# Patient Record
Sex: Male | Born: 1990 | Race: White | Hispanic: No | Marital: Single | Smoking: Never smoker
Health system: Southern US, Community
[De-identification: ages and names within clinical notes are randomized; demographics above are authoritative.]

## PROBLEM LIST (undated history)

## (undated) DIAGNOSIS — J45909 Unspecified asthma, uncomplicated: Secondary | ICD-10-CM

## (undated) HISTORY — PX: TYMPANOSTOMY TUBE PLACEMENT: SHX32

## (undated) HISTORY — PX: KNEE SURGERY: SHX244

---

## 2012-01-12 ENCOUNTER — Encounter (HOSPITAL_COMMUNITY): Payer: Self-pay

## 2012-01-12 ENCOUNTER — Emergency Department (HOSPITAL_COMMUNITY)
Admission: EM | Admit: 2012-01-12 | Discharge: 2012-01-12 | Disposition: A | Discharge status: Home / Self Care | Payer: TRICARE For Life (TFL) | Attending: Emergency Medicine | Admitting: Emergency Medicine

## 2012-01-12 DIAGNOSIS — E860 Dehydration: Secondary | ICD-10-CM | POA: Insufficient documentation

## 2012-01-12 DIAGNOSIS — R112 Nausea with vomiting, unspecified: Secondary | ICD-10-CM | POA: Insufficient documentation

## 2012-01-12 DIAGNOSIS — R000 Tachycardia, unspecified: Secondary | ICD-10-CM | POA: Insufficient documentation

## 2012-01-12 LAB — COMPREHENSIVE METABOLIC PANEL
BUN: 17 mg/dL (ref 6–23)
CO2: 27 mEq/L (ref 19–32)
Calcium: 9.3 mg/dL (ref 8.4–10.5)
Chloride: 98 mEq/L (ref 96–112)
Creatinine, Ser: 0.98 mg/dL (ref 0.50–1.35)
GFR calc Af Amer: >90 mL/min (ref >90)
GFR calc non Af Amer: >90 mL/min (ref >90)
Glucose, Bld: 120 mg/dL — ABNORMAL HIGH (ref 70–99)
Total Bilirubin: 1.8 mg/dL — ABNORMAL HIGH (ref 0.3–1.2)

## 2012-01-12 LAB — CBC WITH DIFFERENTIAL/PLATELET
Eosinophils Relative: 0 % (ref 0–5)
HCT: 45.3 % (ref 39.0–52.0)
Hemoglobin: 15.5 g/dL (ref 13.0–17.0)
Lymphocytes Relative: 11 % — ABNORMAL LOW (ref 12–46)
MCV: 89.3 fL (ref 78.0–100.0)
Monocytes Absolute: 0.6 10*3/uL (ref 0.1–1.0)
Monocytes Relative: 7 % (ref 3–12)
Neutro Abs: 6.7 10*3/uL (ref 1.7–7.7)
RDW: 12.8 % (ref 11.5–15.5)
WBC: 8.1 10*3/uL (ref 4.0–10.5)

## 2012-01-12 LAB — URINALYSIS, ROUTINE W REFLEX MICROSCOPIC
Glucose, UA: NEGATIVE mg/dL
Ketones, ur: NEGATIVE mg/dL
Leukocytes, UA: NEGATIVE
Nitrite: NEGATIVE
Protein, ur: NEGATIVE mg/dL
Urobilinogen, UA: 1 mg/dL (ref 0.0–1.0)

## 2012-01-12 LAB — LIPASE, BLOOD: Lipase: 21 U/L (ref 11–59)

## 2012-01-12 MED ORDER — SODIUM CHLORIDE 0.9 % IV BOLUS (SEPSIS)
1000.0000 mL | Freq: Once | INTRAVENOUS | Status: AC
  Administered 2012-01-12: 1000 mL via INTRAVENOUS

## 2012-01-12 MED ORDER — ONDANSETRON 4 MG PO TBDP
ORAL_TABLET | ORAL | Status: AC
  Administered 2012-01-12: 4 mg via ORAL
  Filled 2012-01-12: qty 1

## 2012-01-12 MED ORDER — ONDANSETRON HCL 4 MG/2ML IJ SOLN
4.0000 mg | Freq: Once | INTRAMUSCULAR | Status: AC
  Administered 2012-01-12: 4 mg via INTRAVENOUS
  Filled 2012-01-12: qty 2

## 2012-01-12 MED ORDER — ONDANSETRON 4 MG PO TBDP
4.0000 mg | ORAL_TABLET | Freq: Once | ORAL | Status: AC
  Administered 2012-01-12: 4 mg via ORAL

## 2012-01-12 MED ORDER — ONDANSETRON HCL 4 MG PO TABS: 4.0000 mg | ORAL_TABLET | Freq: Four times a day (QID) | ORAL | Status: DC

## 2012-01-12 NOTE — ED Provider Notes (Signed)
History     CSN: [id number]  Arrival date & time 01/12/12  1941   First MD Initiated Contact with Patient 01/12/12 2106      Chief Complaint  Patient presents with  . Emesis    (Consider location/radiation/quality/duration/timing/severity/associated sxs/prior treatment) HPI  21 year old male with no significant past medical history presents complaining of persistent vomiting. Patient woke up at 1:30 this a.m. complaining of nausea and proceeds to had multiple bouts of vomitus. Vomitus initially of food content and now just dry heaves. Vomitus is nonbloody, nonbilious. He vomits roughly every 30 minutes. Initially no abdominal pain, but now experience some discomfort in his throat from persistent vomiting. He felt feverish and chilled and has a home temperature of 101.  he tries taking Tylenol unable to keep medication down. Patient currently felt dehydrated, complaining of throbbing headache, and body aches. He denies sneezing, coughing, nasal congestion, chest pain, shortness of breath, abdominal pain, back pain, or dysuria. He denies rash. No recent travel, eat any exotic food, and no recent sick contact. Patient denies any recent alcohol use. Last bowel movement this morning, and was normal.  History reviewed. No pertinent past medical history.  Past Surgical History  Procedure Date  . Knee surgery   . Tympanostomy tube placement     History reviewed. No pertinent family history.  History  Substance Use Topics  . Smoking status: Never Smoker   . Smokeless tobacco: Not on file  . Alcohol Use: Yes     Comment: social      Review of Systems  All other systems reviewed and are negative.    Allergies  Review of patient's allergies indicates no known allergies.  Home Medications   Current Outpatient Rx  Name  Route  Sig  Dispense  Refill  . ACETAMINOPHEN 500 MG PO TABS   Oral   Take 500 mg by mouth every 6 (six) hours as needed. For pain.         Marland Kitchen FLUTICASONE  PROPIONATE 50 MCG/ACT NA SUSP   Nasal   Place 2 sprays into the nose daily as needed. For allergies.           BP 133/72  Pulse 105  Temp 99.5 F (37.5 C) (Oral)  Resp 16  SpO2 99%  Physical Exam  Nursing note and vitals reviewed. Constitutional: He appears well-developed and well-nourished. No distress.       Awake, alert, nontoxic appearance  HENT:  Head: Atraumatic.  Mouth/Throat: Oropharynx is clear and moist.  Eyes: Conjunctivae normal are normal. Right eye exhibits no discharge. Left eye exhibits no discharge.  Neck: Normal range of motion. Neck supple.  Cardiovascular: Normal rate and regular rhythm.   Pulmonary/Chest: Effort normal. No respiratory distress. He exhibits no tenderness.  Abdominal: Soft. There is no tenderness. There is no rebound and no guarding.  Musculoskeletal: He exhibits no edema and no tenderness.       ROM appears intact, no obvious focal weakness  Neurological: He is alert.  Skin: Skin is warm and dry. No rash noted.  Psychiatric: He has a normal mood and affect.    ED Course  Procedures (including critical care time)  Labs Reviewed - No data to display No results found.   No diagnosis found. Results for orders placed during the hospital encounter of 01/12/12  CBC WITH DIFFERENTIAL      Component Value Range   WBC 8.1  4.0 - 10.5 K/uL   RBC 5.07  4.22 - 5.81 MIL/uL  Hemoglobin 15.5  13.0 - 17.0 g/dL   HCT 46.9  62.9 - 52.8 %   MCV 89.3  78.0 - 100.0 fL   MCH 30.6  26.0 - 34.0 pg   MCHC 34.2  30.0 - 36.0 g/dL   RDW 41.3  24.4 - 01.0 %   Platelets 141 (*) 150 - 400 K/uL   Neutrophils Relative 82 (*) 43 - 77 %   Neutro Abs 6.7  1.7 - 7.7 K/uL   Lymphocytes Relative 11 (*) 12 - 46 %   Lymphs Abs 0.9  0.7 - 4.0 K/uL   Monocytes Relative 7  3 - 12 %   Monocytes Absolute 0.6  0.1 - 1.0 K/uL   Eosinophils Relative 0  0 - 5 %   Eosinophils Absolute 0.0  0.0 - 0.7 K/uL   Basophils Relative 0  0 - 1 %   Basophils Absolute 0.0  0.0 -  0.1 K/uL  COMPREHENSIVE METABOLIC PANEL      Component Value Range   Sodium 136  135 - 145 mEq/L   Potassium 3.5  3.5 - 5.1 mEq/L   Chloride 98  96 - 112 mEq/L   CO2 27  19 - 32 mEq/L   Glucose, Bld 120 (*) 70 - 99 mg/dL   BUN 17  6 - 23 mg/dL   Creatinine, Ser 2.72  0.50 - 1.35 mg/dL   Calcium 9.3  8.4 - 53.6 mg/dL   Total Protein 7.5  6.0 - 8.3 g/dL   Albumin 4.1  3.5 - 5.2 g/dL   AST 20  0 - 37 U/L   ALT 24  0 - 53 U/L   Alkaline Phosphatase 64  39 - 117 U/L   Total Bilirubin 1.8 (*) 0.3 - 1.2 mg/dL   GFR calc non Af Amer >90  >90 mL/min   GFR calc Af Amer >90  >90 mL/min  LIPASE, BLOOD      Component Value Range   Lipase 21  11 - 59 U/L  URINALYSIS, ROUTINE W REFLEX MICROSCOPIC      Component Value Range   Color, Urine AMBER (*) YELLOW   APPearance CLEAR  CLEAR   Specific Gravity, Urine 1.043 (*) 1.005 - 1.030   pH 6.0  5.0 - 8.0   Glucose, UA NEGATIVE  NEGATIVE mg/dL   Hgb urine dipstick NEGATIVE  NEGATIVE   Bilirubin Urine SMALL (*) NEGATIVE   Ketones, ur NEGATIVE  NEGATIVE mg/dL   Protein, ur NEGATIVE  NEGATIVE mg/dL   Urobilinogen, UA 1.0  0.0 - 1.0 mg/dL   Nitrite NEGATIVE  NEGATIVE   Leukocytes, UA NEGATIVE  NEGATIVE   No results found.  1. Viral GI   MDM  Pt with nausea and vomiting.  Abdomen non surgical.  Does not look toxic.  Is tachycardic and mildly dehydrated.  IVF started, will check labs.  Zofran given.  No significant pain at this time, pain meds offer, pt declined.     10:54 PM Shows mild dehydration. Patient felt much better after IV fluid. She request to be discharged. No vomiting at this time. Patient is able to tolerates by mouth.  BP 133/72  Pulse 105  Temp 99.5 F (37.5 C) (Oral)  Resp 16  SpO2 99%  I have reviewed nursing notes and vital signs.  I reviewed available ER/hospitalization records thought the EMR    Fayrene Helper, [place] 01/12/12 2255

## 2012-01-12 NOTE — ED Notes (Signed)
Pt c/o n/v since 0100 about every 30 minutes. Pt states he is unable to keep anything down. Pt states he has had a fever of 101 today. Pt took some Tylenol, but it didn't stay down. Pt c/o headache and body aches. Pt denies congestion or cough. Pt a/o x 3. Skin warm and dry.

## 2012-01-12 NOTE — ED Provider Notes (Signed)
Medical screening examination/treatment/procedure(s) were performed by non-physician practitioner and as supervising physician I was immediately available for consultation/collaboration. Devoria Albe, MD, Arm[facility] Gang   Ward Givens, MD 01/12/12 [phone]

## 2012-01-12 NOTE — ED Notes (Signed)
Pt Unable to void at this time.

## 2012-03-19 ENCOUNTER — Encounter (HOSPITAL_COMMUNITY): Payer: Self-pay | Admitting: Emergency Medicine

## 2012-03-19 ENCOUNTER — Emergency Department (HOSPITAL_COMMUNITY)

## 2012-03-19 ENCOUNTER — Emergency Department (HOSPITAL_COMMUNITY)
Admission: EM | Admit: 2012-03-19 | Discharge: 2012-03-19 | Disposition: A | Discharge status: Home / Self Care | Attending: Emergency Medicine | Admitting: Emergency Medicine

## 2012-03-19 DIAGNOSIS — W010XXA Fall on same level from slipping, tripping and stumbling without subsequent striking against object, initial encounter: Secondary | ICD-10-CM | POA: Insufficient documentation

## 2012-03-19 DIAGNOSIS — X500XXA Overexertion from strenuous movement or load, initial encounter: Secondary | ICD-10-CM | POA: Insufficient documentation

## 2012-03-19 DIAGNOSIS — Y9389 Activity, other specified: Secondary | ICD-10-CM | POA: Insufficient documentation

## 2012-03-19 DIAGNOSIS — R229 Localized swelling, mass and lump, unspecified: Secondary | ICD-10-CM | POA: Insufficient documentation

## 2012-03-19 DIAGNOSIS — S93409A Sprain of unspecified ligament of unspecified ankle, initial encounter: Secondary | ICD-10-CM | POA: Insufficient documentation

## 2012-03-19 DIAGNOSIS — S93401A Sprain of unspecified ligament of right ankle, initial encounter: Secondary | ICD-10-CM

## 2012-03-19 DIAGNOSIS — Y9289 Other specified places as the place of occurrence of the external cause: Secondary | ICD-10-CM | POA: Insufficient documentation

## 2012-03-19 MED ORDER — NAPROXEN 500 MG PO TABS: 500.0000 mg | ORAL_TABLET | Freq: Two times a day (BID) | ORAL | Status: DC

## 2012-03-19 NOTE — ED Provider Notes (Signed)
History     CSN: [id number]  Arrival date & time 03/19/12  1254   First MD Initiated Contact with Patient 03/19/12 1420      Chief Complaint  Patient presents with  . Fall  . Ankle Injury    (Consider location/radiation/quality/duration/timing/severity/associated sxs/prior treatment) HPI Jamie Donovan is a 22 y.o. male who presents to ED complaining of an ankle injury. State slipped on ice and turned right ankle. Pain to the lateral aspect. Swelling. Able to bear weight, but not ambulate. No pain at the knee or foot. No numbness or weakness distal to the injury. No other complaints. Has not tried any treatments.    History reviewed. No pertinent past medical history.  Past Surgical History  Procedure Laterality Date  . Knee surgery    . Tympanostomy tube placement      Family History  Problem Relation Age of Onset  . Diabetes Other     History  Substance Use Topics  . Smoking status: Never Smoker   . Smokeless tobacco: Not on file  . Alcohol Use: Yes     Comment: social      Review of Systems  Constitutional: Negative for fever and chills.  HENT: Negative for neck pain and neck stiffness.   Respiratory: Negative.   Cardiovascular: Negative.   Musculoskeletal: Positive for joint swelling and arthralgias.  Neurological: Negative for weakness and numbness.    Allergies  Review of patient's allergies indicates no known allergies.  Home Medications   Current Outpatient Rx  Name  Route  Sig  Dispense  Refill  . naproxen (NAPROSYN) 500 MG tablet   Oral   Take 1 tablet (500 mg total) by mouth 2 (two) times daily.   30 tablet   0     BP 129/78  Pulse 85  Temp(Src) 98.9 F (37.2 C) (Oral)  Resp 18  Ht 5\' 9"  (1.753 m)  Wt 259 lb (117.482 kg)  BMI 38.23 kg/m2  SpO2 100%  Physical Exam  Nursing note and vitals reviewed. Constitutional: He is oriented to person, place, and time. He appears well-developed and well-nourished. No distress.   Cardiovascular: Normal rate, regular rhythm and normal heart sounds.   Pulmonary/Chest: Effort normal and breath sounds normal. No respiratory distress. He has no wheezes. He has no rales.  Musculoskeletal:  Right ankle swelling over lateral aspect. Tender to palpation. Pain with plantar flexion and inversion. Full rom. Achilles tendon intact. no tenderness At the knee. Normal foot.     Neurological: He is alert and oriented to person, place, and time.  Skin: Skin is warm and dry.    ED Course  Procedures (including critical care time)  Labs Reviewed - No data to display Dg Ankle Complete Right  03/19/2012  *RADIOLOGY REPORT*  Clinical Data: Right ankle pain.  Injury.  RIGHT ANKLE - COMPLETE 3+ VIEW  Comparison: None.  Findings: No acute fracture and no dislocation.  Sclerotic lesion in the distal tibia metaphysis has a nonaggressive appearance. Soft tissues are unremarkable.  IMPRESSION: No acute bony pathology.   Original Report Authenticated By: Jolaine Click, M.D.      1. Ankle sprain, right, initial encounter       MDM  Pt with right ankle injury. X-ray negative. No other injuries. ASO, crutches. Follow up with ortho.           Lottie Mussel, PA 03/19/12 1626

## 2012-03-19 NOTE — ED Notes (Signed)
Patient transported to X-ray 

## 2012-03-19 NOTE — ED Notes (Signed)
Fell on ice last night and injured his right ankle, limited ROM on right ankle, swelling noted, tender at palpation.

## 2012-03-22 NOTE — ED Provider Notes (Signed)
Medical screening examination/treatment/procedure(s) were performed by non-physician practitioner and as supervising physician I was immediately available for consultation/collaboration.  Cornie Herrington R. Sotirios Navarro, MD 03/22/12 1101 

## 2013-04-02 ENCOUNTER — Encounter (HOSPITAL_COMMUNITY): Payer: Self-pay | Admitting: Emergency Medicine

## 2013-04-02 ENCOUNTER — Emergency Department (HOSPITAL_COMMUNITY)
Admission: EM | Admit: 2013-04-02 | Discharge: 2013-04-02 | Disposition: A | Discharge status: Home / Self Care | Payer: Managed Care, Other (non HMO) | Attending: Emergency Medicine | Admitting: Emergency Medicine

## 2013-04-02 DIAGNOSIS — J45909 Unspecified asthma, uncomplicated: Secondary | ICD-10-CM | POA: Insufficient documentation

## 2013-04-02 DIAGNOSIS — R197 Diarrhea, unspecified: Secondary | ICD-10-CM

## 2013-04-02 DIAGNOSIS — Z791 Long term (current) use of non-steroidal anti-inflammatories (NSAID): Secondary | ICD-10-CM | POA: Insufficient documentation

## 2013-04-02 DIAGNOSIS — R000 Tachycardia, unspecified: Secondary | ICD-10-CM | POA: Insufficient documentation

## 2013-04-02 DIAGNOSIS — R112 Nausea with vomiting, unspecified: Secondary | ICD-10-CM | POA: Insufficient documentation

## 2013-04-02 DIAGNOSIS — R1110 Vomiting, unspecified: Secondary | ICD-10-CM

## 2013-04-02 DIAGNOSIS — R630 Anorexia: Secondary | ICD-10-CM | POA: Insufficient documentation

## 2013-04-02 HISTORY — DX: Unspecified asthma, uncomplicated: J45.909

## 2013-04-02 LAB — CBC WITH DIFFERENTIAL/PLATELET
Basophils Absolute: 0 10*3/uL (ref 0.0–0.1)
Basophils Relative: 0 % (ref 0–1)
EOS ABS: 0 10*3/uL (ref 0.0–0.7)
EOS PCT: 0 % (ref 0–5)
HCT: 49.7 % (ref 39.0–52.0)
HEMOGLOBIN: 17.4 g/dL — AB (ref 13.0–17.0)
LYMPHS ABS: 0.5 10*3/uL — AB (ref 0.7–4.0)
LYMPHS PCT: 4 % — AB (ref 12–46)
MCH: 31.6 pg (ref 26.0–34.0)
MCHC: 35 g/dL (ref 30.0–36.0)
MCV: 90.2 fL (ref 78.0–100.0)
MONOS PCT: 4 % (ref 3–12)
Monocytes Absolute: 0.6 10*3/uL (ref 0.1–1.0)
NEUTROS PCT: 92 % — AB (ref 43–77)
Neutro Abs: 12.5 10*3/uL — ABNORMAL HIGH (ref 1.7–7.7)
Platelets: 210 10*3/uL (ref 150–400)
RBC: 5.51 MIL/uL (ref 4.22–5.81)
RDW: 13.1 % (ref 11.5–15.5)
WBC: 13.6 10*3/uL — ABNORMAL HIGH (ref 4.0–10.5)

## 2013-04-02 LAB — COMPREHENSIVE METABOLIC PANEL
ALK PHOS: 87 U/L (ref 39–117)
ALT: 28 U/L (ref 0–53)
AST: 22 U/L (ref 0–37)
Albumin: 4.5 g/dL (ref 3.5–5.2)
BILIRUBIN TOTAL: 1.7 mg/dL — AB (ref 0.3–1.2)
BUN: 19 mg/dL (ref 6–23)
CO2: 23 meq/L (ref 19–32)
Calcium: 10 mg/dL (ref 8.4–10.5)
Chloride: 97 mEq/L (ref 96–112)
Creatinine, Ser: 1.13 mg/dL (ref 0.50–1.35)
GLUCOSE: 158 mg/dL — AB (ref 70–99)
POTASSIUM: 4.8 meq/L (ref 3.7–5.3)
Sodium: 139 mEq/L (ref 137–147)
TOTAL PROTEIN: 8.5 g/dL — AB (ref 6.0–8.3)

## 2013-04-02 MED ORDER — ONDANSETRON 4 MG PO TBDP: ORAL_TABLET | ORAL | Status: AC

## 2013-04-02 MED ORDER — SODIUM CHLORIDE 0.9 % IV BOLUS (SEPSIS)
1000.0000 mL | Freq: Once | INTRAVENOUS | Status: AC
  Administered 2013-04-02: 1000 mL via INTRAVENOUS

## 2013-04-02 MED ORDER — ONDANSETRON HCL 4 MG/2ML IJ SOLN
4.0000 mg | Freq: Once | INTRAMUSCULAR | Status: AC
  Administered 2013-04-02: 4 mg via INTRAVENOUS
  Filled 2013-04-02: qty 2

## 2013-04-02 NOTE — ED Notes (Signed)
Per pt report: pt c/o N/V/D that began yesterday.  Pt reports having 3 episodes of emesis yesterday and reports emesis "once every 20 minutes."  Pt reports multiple episodes of diarrhea as well.  Pt reports having chills and some SOB.  Pt a/o x 4.  Ambulatory in triage.  NAD noted.

## 2013-04-02 NOTE — Discharge Instructions (Signed)
If you were given medicines take as directed.  If you are on coumadin or contraceptives realize their levels and effectiveness is altered by many different medicines.  If you have any reaction (rash, tongues swelling, other) to the medicines stop taking and see a physician.   Please follow up as directed and return to the ER or see a physician for new or worsening symptoms.  Thank you. Take zofran for nausea. Gradually increase oral intake.

## 2013-04-02 NOTE — ED Provider Notes (Signed)
CSN: [id number]     Arrival date & time 04/02/13  1957 History   First MD Initiated Contact with Patient 04/02/13 2136     Chief Complaint  Patient presents with  . Nausea  . Emesis  . Diarrhea     (Consider location/radiation/quality/duration/timing/severity/associated sxs/prior Treatment) HPI Comments: [age] yo male with no medical hx, no smoking presents with vomiting, diarrhea multiple times for two days, gf with similar sxs recently.  No bleeding. No travel, no recent abx. No focal abd pain, mild cramping, unable to tolerate fluids.    Patient is a 23 y.o. male presenting with vomiting and diarrhea. The history is provided by the patient.  Emesis Associated symptoms: diarrhea   Associated symptoms: no abdominal pain, no chills and no headaches   Diarrhea Associated symptoms: vomiting   Associated symptoms: no abdominal pain, no chills, no fever and no headaches     Past Medical History  Diagnosis Date  . Asthma    Past Surgical History  Procedure Laterality Date  . Knee surgery    . Tympanostomy tube placement     Family History  Problem Relation Age of Onset  . Diabetes Other    History  Substance Use Topics  . Smoking status: Never Smoker   . Smokeless tobacco: Not on file  . Alcohol Use: Yes     Comment: social    Review of Systems  Constitutional: Positive for appetite change. Negative for fever and chills.  HENT: Negative for congestion.   Eyes: Negative for visual disturbance.  Respiratory: Negative for shortness of breath.   Cardiovascular: Negative for chest pain.  Gastrointestinal: Positive for nausea, vomiting and diarrhea. Negative for abdominal pain.  Genitourinary: Negative for dysuria and flank pain.  Musculoskeletal: Negative for back pain, neck pain and neck stiffness.  Skin: Negative for rash.  Neurological: Negative for light-headedness and headaches.      Allergies  Review of patient's allergies indicates no known allergies.  Home  Medications   Current Outpatient Rx  Name  Route  Sig  Dispense  Refill  . ACETAMINOPHEN PO   Oral   Take 1 tablet by mouth every 6 (six) hours as needed (pain.).         Marland Kitchen. naproxen sodium (ANAPROX) 220 MG tablet   Oral   Take 220 mg by mouth 2 (two) times daily with a meal.          BP 127/71  Pulse 116  Temp(Src) 99.4 F (37.4 C) (Oral)  Resp 20  Ht 5\' 8"  (1.727 m)  Wt 290 lb (131.543 kg)  BMI 44.10 kg/m2  SpO2 95% Physical Exam  Nursing note and vitals reviewed. Constitutional: He is oriented to person, place, and time. He appears well-developed and well-nourished.  HENT:  Head: Normocephalic and atraumatic.  Mild dry mm  Eyes: Conjunctivae are normal. Right eye exhibits no discharge. Left eye exhibits no discharge.  Neck: Normal range of motion. Neck supple. No tracheal deviation present.  Cardiovascular: Regular rhythm.  Tachycardia present.   Pulmonary/Chest: Effort normal and breath sounds normal.  Abdominal: Soft. He exhibits no distension. There is tenderness (mild epig). There is no guarding.  Musculoskeletal: He exhibits no edema.  Neurological: He is alert and oriented to person, place, and time.  Skin: Skin is warm. No rash noted.  Psychiatric: He has a normal mood and affect.    ED Course  Procedures (including critical care time) Labs Review Labs Reviewed  CBC WITH DIFFERENTIAL - Abnormal; Notable  for the following:    WBC 13.6 (*)    Hemoglobin 17.4 (*)    Neutrophils Relative % 92 (*)    Neutro Abs 12.5 (*)    Lymphocytes Relative 4 (*)    Lymphs Abs 0.5 (*)    All other components within normal limits  COMPREHENSIVE METABOLIC PANEL - Abnormal; Notable for the following:    Glucose, Bld 158 (*)    Total Protein 8.5 (*)    Total Bilirubin 1.7 (*)    All other components within normal limits   Imaging Review No results found.   EKG Interpretation None      MDM   Final diagnoses:  Vomiting  Diarrhea    Clinically GE with  dehydration. Antiemetics, IV fluids No concern for acute abdo pain at this time.  Pt improved in ED.  Results and differential diagnosis were discussed with the patient. Close follow up outpatient was discussed, patient comfortable with the plan.       Enid Skeens, MD 04/02/13 [phone]

## 2014-10-11 IMAGING — CR DG ANKLE COMPLETE 3+V*R*
3 series · 3 of 3 positions shown · non-contrast
Comparison: None.

CLINICAL DATA: Right ankle pain.  Injury.

RIGHT ANKLE - COMPLETE 3+ VIEW

[x ankle ap right]
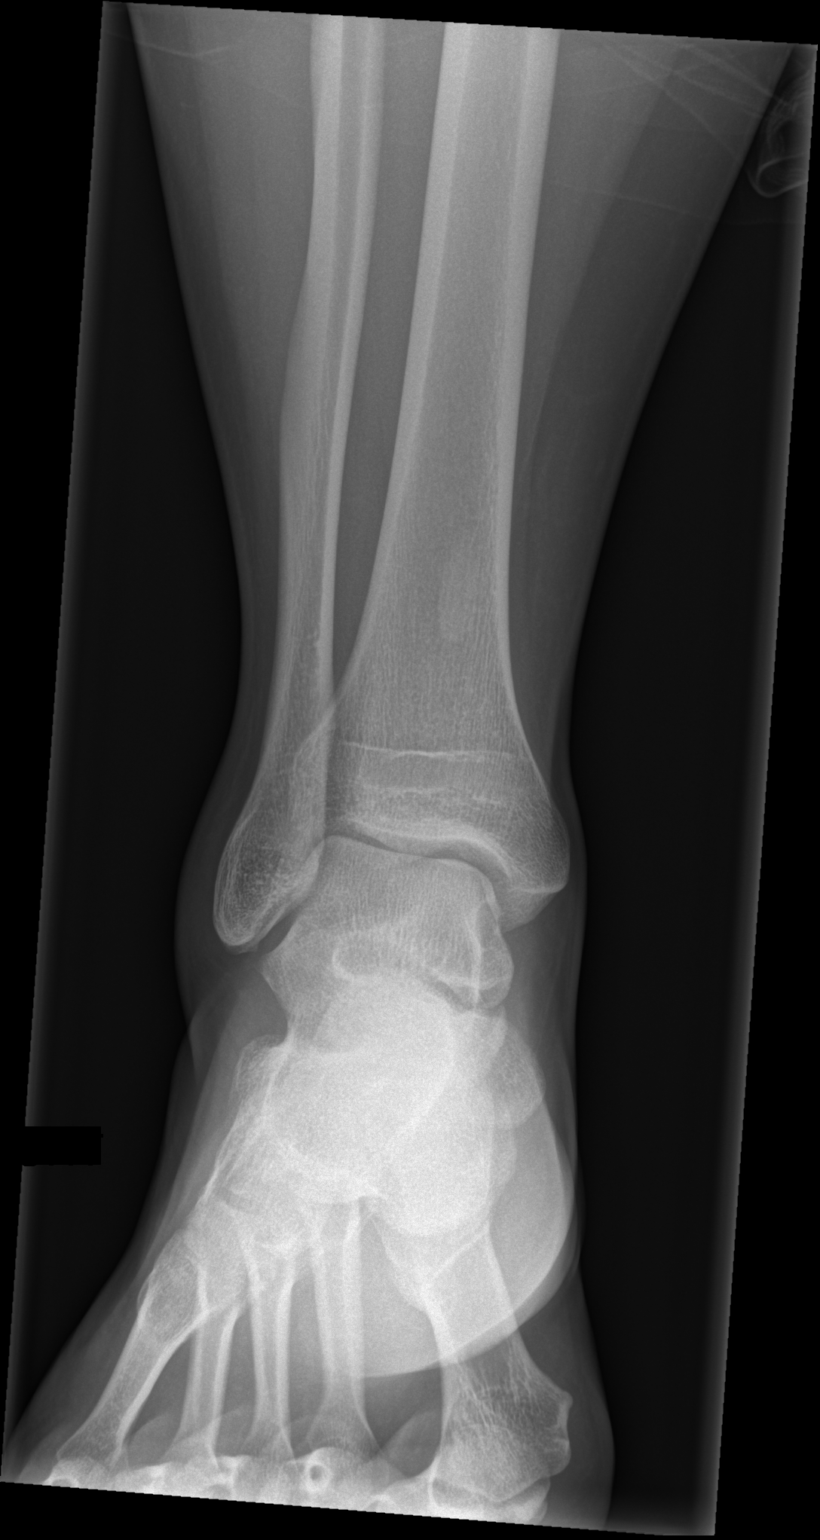

[x ankle obl right]
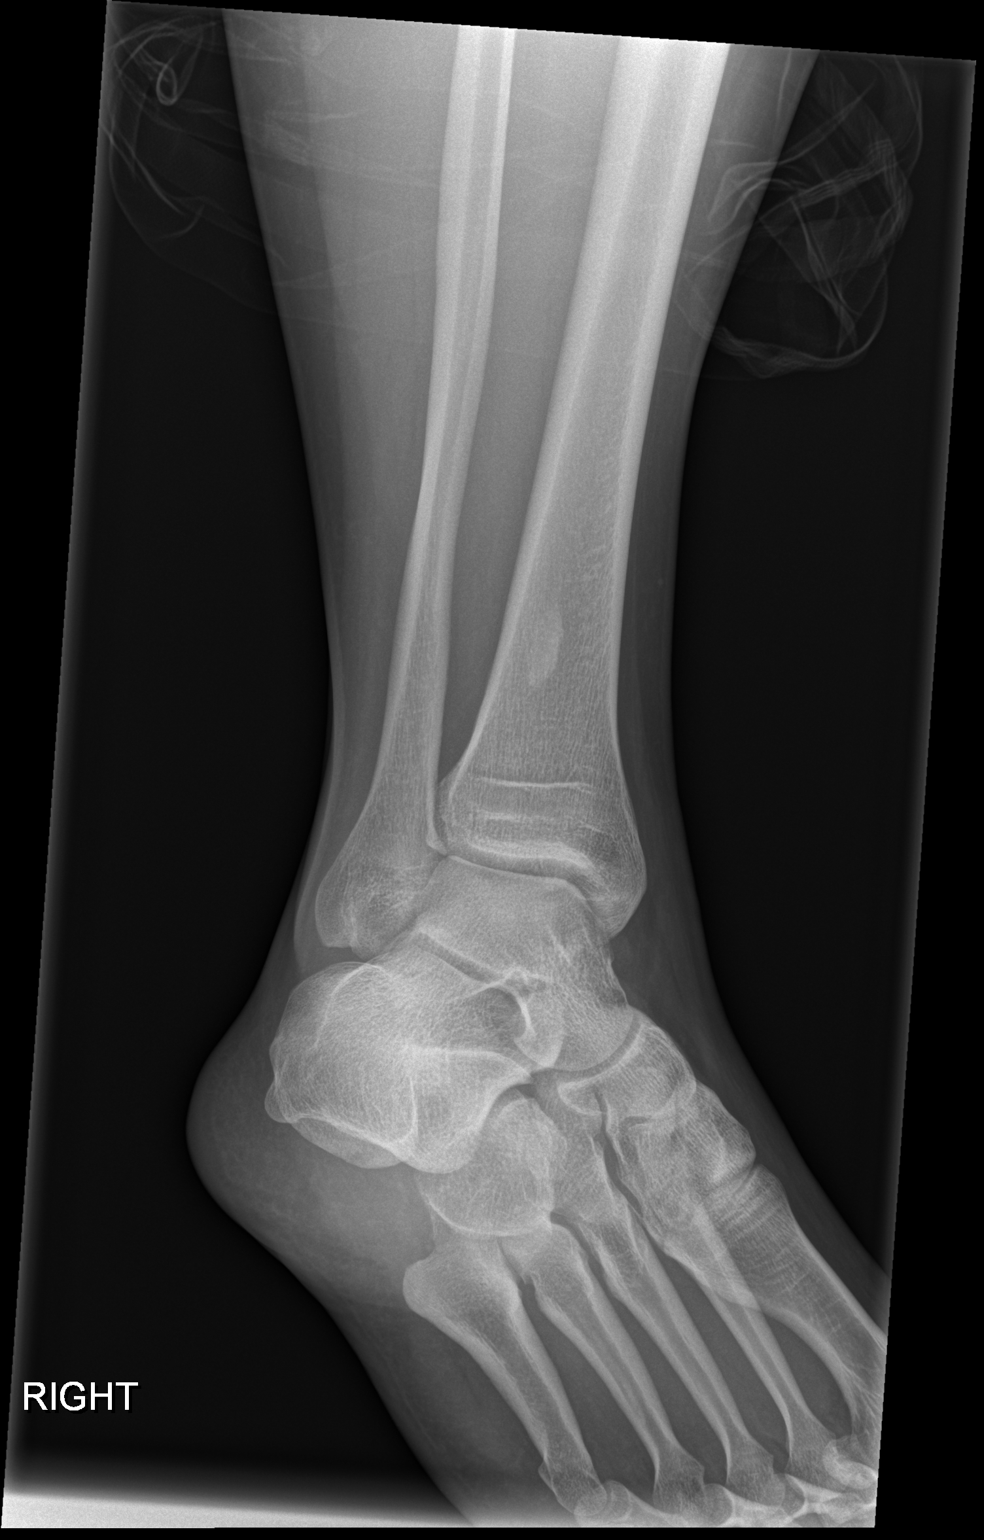

[x ankle lat right]
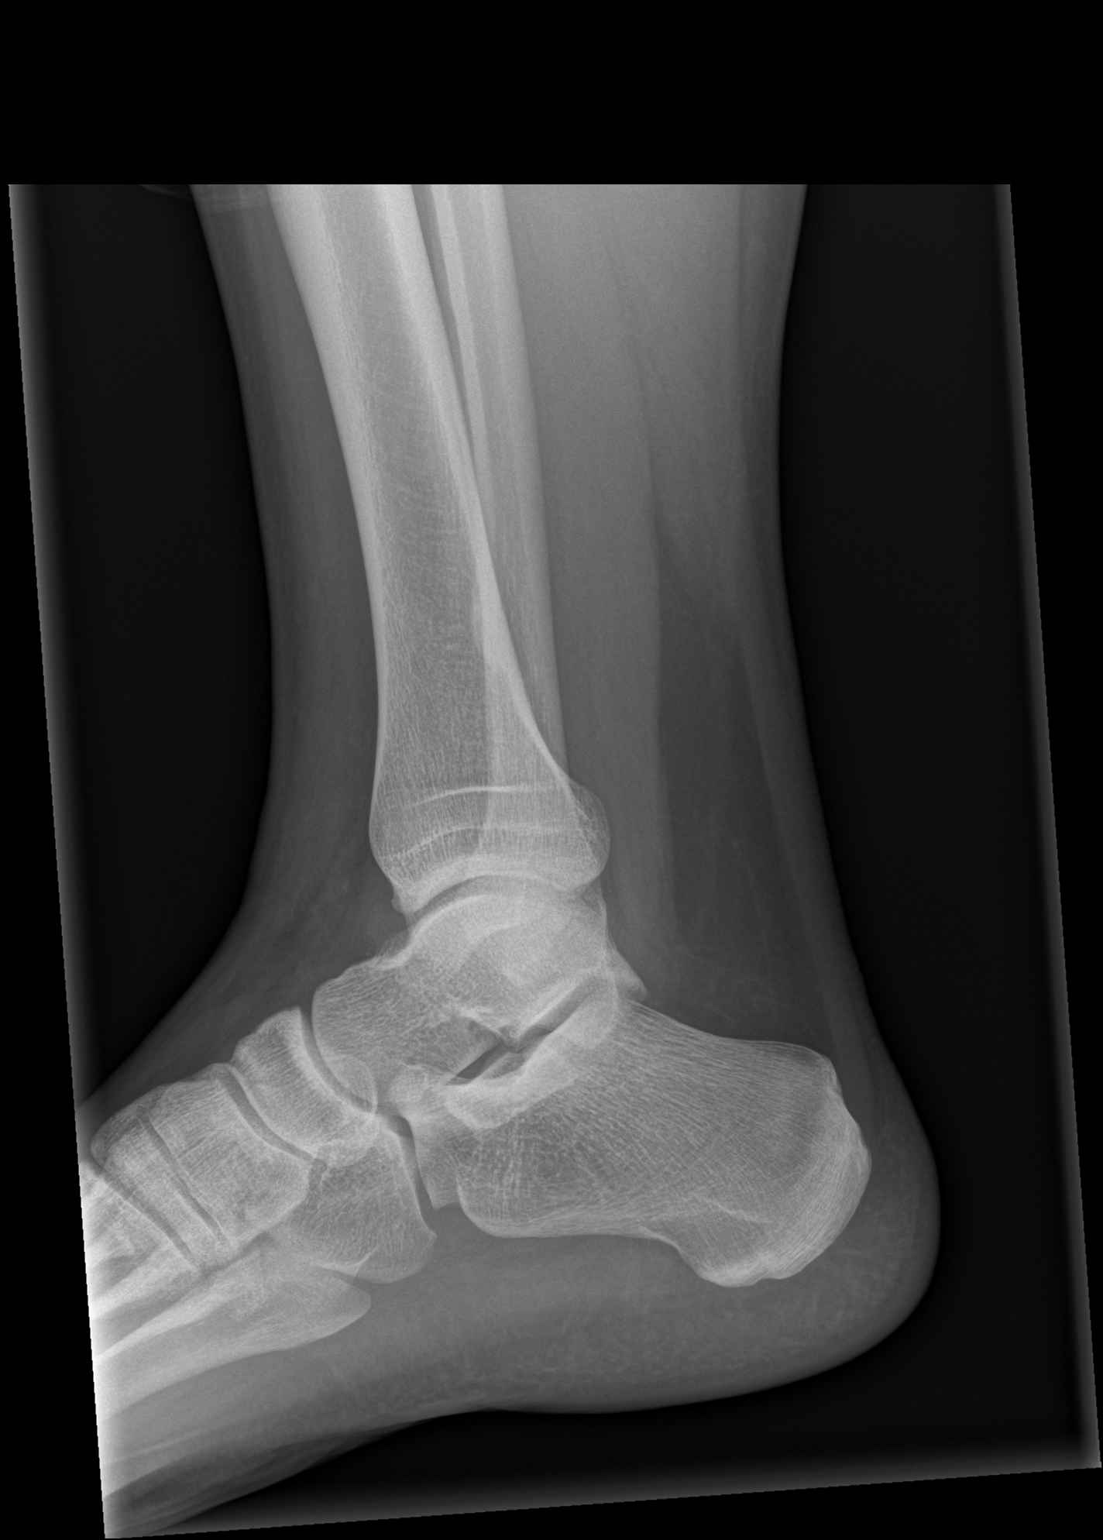

[3 of 3 positions shown; findings below may reference images not displayed]

FINDINGS: No acute fracture and no dislocation.  Sclerotic lesion
in the distal tibia metaphysis has a nonaggressive appearance.
Soft tissues are unremarkable.
IMPRESSION: No acute bony pathology.

## 2021-02-02 ENCOUNTER — Emergency Department: Admit: 2021-02-02 | Payer: PRIVATE HEALTH INSURANCE

## 2021-02-02 ENCOUNTER — Inpatient Hospital Stay
Admit: 2021-02-02 | Discharge: 2021-02-02 | Disposition: A | Discharge status: Home / Self Care | Payer: PRIVATE HEALTH INSURANCE | Attending: Emergency Medicine

## 2021-02-02 DIAGNOSIS — S39012A Strain of muscle, fascia and tendon of lower back, initial encounter: Secondary | ICD-10-CM

## 2021-02-02 MED ORDER — KETOROLAC TROMETHAMINE 15 MG/ML IJ SOLN
15 MG/ML | Freq: Once | INTRAMUSCULAR | Status: AC
Start: 2021-02-02 — End: 2021-02-02
  Administered 2021-02-02: 20:00:00 15 mg via INTRAVENOUS

## 2021-02-02 MED ORDER — DIAZEPAM 5 MG/ML IJ SOLN
5 MG/ML | Freq: Once | INTRAMUSCULAR | Status: AC
Start: 2021-02-02 — End: 2021-02-02
  Administered 2021-02-02: 20:00:00 5 mg via INTRAVENOUS

## 2021-02-02 MED ORDER — METHYLPREDNISOLONE 4 MG PO TBPK
4 MG | PACK | ORAL | 0 refills | Status: AC
Start: 2021-02-02 — End: ?

## 2021-02-02 MED ORDER — LIDOCAINE 5 % EX PTCH
5 % | MEDICATED_PATCH | Freq: Every day | CUTANEOUS | 0 refills | Status: AC
Start: 2021-02-02 — End: 2021-02-12

## 2021-02-02 MED ORDER — MORPHINE SULFATE 4 MG/ML IV SOLN
4 MG/ML | INTRAVENOUS | Status: AC
Start: 2021-02-02 — End: 2021-02-02
  Administered 2021-02-02: 21:00:00 4 mg via INTRAVENOUS

## 2021-02-02 MED ORDER — HYDROCODONE-ACETAMINOPHEN 5-325 MG PO TABS
5-325 MG | ORAL | Status: AC
Start: 2021-02-02 — End: 2021-02-02
  Administered 2021-02-02: 19:00:00 1 via ORAL

## 2021-02-02 MED ORDER — DEXAMETHASONE SOD PHOSPHATE PF 10 MG/ML IJ SOLN
10 MG/ML | Freq: Once | INTRAMUSCULAR | Status: AC
Start: 2021-02-02 — End: 2021-02-02
  Administered 2021-02-02: 21:00:00 10 mg via ORAL

## 2021-02-02 MED ORDER — BUPIVACAINE HCL (PF) 0.5 % IJ SOLN
0.5 % | Freq: Once | INTRAMUSCULAR | Status: DC
Start: 2021-02-02 — End: 2021-02-02

## 2021-02-02 MED ORDER — LIDOCAINE HCL 1 % IJ SOLN
1 % | INTRAMUSCULAR | Status: DC
Start: 2021-02-02 — End: 2021-02-02

## 2021-02-02 MED ORDER — LIDOCAINE HCL (PF) 1 % IJ SOLN
1 % | Freq: Once | INTRAMUSCULAR | Status: DC
Start: 2021-02-02 — End: 2021-02-02

## 2021-02-02 MED ORDER — MORPHINE SULFATE 4 MG/ML IV SOLN
4 MG/ML | Freq: Once | INTRAVENOUS | Status: AC
Start: 2021-02-02 — End: 2021-02-02
  Administered 2021-02-02: 20:00:00 4 mg via INTRAVENOUS

## 2021-02-02 MED ORDER — CYCLOBENZAPRINE HCL 10 MG PO TABS
10 MG | ORAL_TABLET | Freq: Three times a day (TID) | ORAL | 0 refills | Status: AC | PRN
Start: 2021-02-02 — End: 2021-02-12

## 2021-02-02 MED ORDER — MORPHINE SULFATE 4 MG/ML IV SOLN
4 MG/ML | Freq: Once | INTRAVENOUS | Status: AC
Start: 2021-02-02 — End: 2021-02-02
  Administered 2021-02-02: 18:00:00 4 mg via INTRAMUSCULAR

## 2021-02-02 MED ORDER — KETOROLAC TROMETHAMINE 30 MG/ML IJ SOLN
30 MG/ML | Freq: Once | INTRAMUSCULAR | Status: AC
Start: 2021-02-02 — End: 2021-02-02
  Administered 2021-02-02: 18:00:00 30 mg via INTRAMUSCULAR

## 2021-02-02 MED FILL — SENSORCAINE-MPF 0.5 % IJ SOLN: 0.5 % | INTRAMUSCULAR | Qty: 20

## 2021-02-02 MED FILL — LIDOCAINE HCL 1 % IJ SOLN: 1 % | INTRAMUSCULAR | Qty: 20

## 2021-02-02 MED FILL — HYDROCODONE-ACETAMINOPHEN 5-325 MG PO TABS: 5-325 MG | ORAL | Qty: 1

## 2021-02-02 MED FILL — KETOROLAC TROMETHAMINE 15 MG/ML IJ SOLN: 15 MG/ML | INTRAMUSCULAR | Qty: 1

## 2021-02-02 MED FILL — KETOROLAC TROMETHAMINE 30 MG/ML IJ SOLN: 30 MG/ML | INTRAMUSCULAR | Qty: 1

## 2021-02-02 MED FILL — MORPHINE SULFATE 4 MG/ML IV SOLN: 4 mg/mL | INTRAVENOUS | Qty: 1

## 2021-02-02 MED FILL — DIAZEPAM 5 MG/ML IJ SOLN: 5 MG/ML | INTRAMUSCULAR | Qty: 2

## 2021-02-02 MED FILL — XYLOCAINE-MPF 1 % IJ SOLN: 1 % | INTRAMUSCULAR | Qty: 10

## 2021-02-02 MED FILL — DEXAMETHASONE SOD PHOSPHATE PF 10 MG/ML IJ SOLN: 10 MG/ML | INTRAMUSCULAR | Qty: 1

## 2021-02-02 NOTE — ED Notes (Signed)
HEMODYNAMIC MONITORING     Benedetto Goad, RN  02/02/21 1515

## 2021-02-02 NOTE — ED Provider Notes (Signed)
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  Patient states was bending over 4 days ago working on a car, had sudden onset of left lower back pain.  He states pain had initially improved however this morning was bending over to tie his shoes, felt a pop in his left lower back and has had significant pain ever since.  He states pain to left lower back described as constant, tightness, radiating into left leg, worse with back range of motion, movement, moderate to severe on a pain scale.  No other alleviating or aggravating factors for this pain.  He took Flexeril several days ago that he had at home, without significant improvement.  He states he did have history of likely a disc issue with similar mechanism several years ago which improved after a week of medication.  No other chronic back issues.  He has no other associated symptoms.  Denies midline back pain, urinary retention, urinary incontinence, fecal incontinence, saddle anesthesia, lower extremity weakness, lower extremity numbness or tingling, fevers, recent spinal instrumentation, IV drug use.  Denies flank pain, dysuria, hematuria, abdominal pain, nausea, vomiting, diarrhea, chest pain or shortness of breath.    REVIEW OF SYSTEMS       Review of Systems   Constitutional:  Negative for chills and fever.   Respiratory:  Negative for shortness of breath.    Cardiovascular:  Negative for chest pain.   Gastrointestinal:  Negative for abdominal pain, diarrhea, nausea and vomiting.   Genitourinary:  Negative for  dysuria.   Musculoskeletal:  Positive for back pain. Negative for arthralgias, gait problem, joint swelling, myalgias and neck pain.   Neurological:  Negative for dizziness, weakness, numbness and headaches.     Except as noted above the remainder of the review of systems was reviewed and negative.       PAST MEDICAL HISTORY   No past medical history on file.    SURGICAL HISTORY     No past surgical history on file.    CURRENT MEDICATIONS       Discharge Medication List as of 02/02/2021  4:47 PM          ALLERGIES     Patient has no known allergies.    FAMILY HISTORY     No family history on file.     SOCIAL HISTORY       Social History     Socioeconomic History    Marital status: Single       SCREENINGS         Glasgow Coma Scale  Eye Opening: Spontaneous  Best Verbal Response: Oriented  Best Motor Response: Obeys commands  Glasgow Coma Scale Score: 15                     CIWA Assessment  BP: 121/72  Heart Rate: 78  PHYSICAL EXAM    (up to 7 for level 4, 8 or more for level 5)     ED Triage Vitals [02/02/21 1215]   BP Temp Temp Source Heart Rate Resp SpO2 Height Weight   131/71 97.5 ??F (36.4 ??C) Oral 74 18 100 % 5' 9" (1.753 m) (!) 343 lb 14.7 oz (156 kg)       Physical Exam  Constitutional:       General: He is not in acute distress.     Appearance: Normal appearance. He is well-developed. He is not ill-appearing, toxic-appearing or diaphoretic.   HENT:      Head: Normocephalic.      Mouth/Throat:      Mouth: Mucous membranes are moist.   Cardiovascular:      Rate and Rhythm: Normal rate and regular rhythm.      Heart sounds: Normal heart sounds. No murmur heard.  Pulmonary:      Effort: Pulmonary effort is normal. No respiratory distress.      Breath sounds: Normal breath sounds. No wheezing.   Abdominal:      General: Abdomen is flat. There is no distension.      Palpations: Abdomen is soft. There is no mass.      Tenderness: There is no abdominal tenderness. There is no right CVA tenderness or left  CVA tenderness. Negative signs include Murphy's sign.      Hernia: No hernia is present.   Musculoskeletal:         General: Tenderness present. No swelling, deformity or signs of injury. Normal range of motion.      Cervical back: Normal range of motion. No rigidity.      Comments: Moderate tenderness to left lower back.  Decreased back range of motion due to pain.  No midline tenderness, deformity, step-off.  Negative straight leg raise   Lymphadenopathy:      Cervical: No cervical adenopathy.   Skin:     General: Skin is warm and dry.      Capillary Refill: Capillary refill takes less than 2 seconds.   Neurological:      General: No focal deficit present.      Mental Status: He is alert and oriented to person, place, and time.      Motor: No weakness.      Comments: Muscle strength and sensation intact to lower extremities, DTRs intact.  Neurovascular exam to distal lower extremities.   Psychiatric:         Mood and Affect: Mood normal.         Behavior: Behavior normal.       DIAGNOSTIC RESULTS   PROCEDURES:  Unless otherwise noted below, none     Procedures    EKG: All EKG's are interpreted by the Emergency Department Physician who either signs or Co-signs this chart in the absence of a cardiologist.      RADIOLOGY:   Non-plain film images such as CT, Ultrasound and MRI are read by the radiologist. Plain radiographic images are visualized and preliminarily interpreted by the emergency physician with the below findings:    Interpretation per the Radiologist below, if available at the time of this note:    CT LUMBAR SPINE WO CONTRAST   Final Result   1. No acute lumbar spine abnormality.      2. Mild disc bulges at L4-5 and L5-S1.          ED BEDSIDE ULTRASOUND:   Performed by ED Physician -  none    LABS:  Labs Reviewed - No data to display    All other labs were within normal range or not returned as of this dictation.    EMERGENCY DEPARTMENT COURSE/REASSESSMENT and MDM:   Vitals:    Vitals:    02/02/21 1347  02/02/21 1511 02/02/21 1526 02/02/21 1619   BP: 123/68 115/71 122/74 121/72   Pulse: 70 67  78   Resp: _0 Temp:       TempSrc:       SpO2: 98% 99%  100%   Weight:       Height:           ED Course:    ED Course as of 02/02/21 1810   Mon Feb 02, 2021   1331 Patient states no improvement in pain after Toradol and morphine.  He still not bear weight on his legs due to pain.  Will give second dose of pain medication and reassess. [AR]   1435 Patient has significant improvement in pain, he did get out of the bed without assistance which is significant improvement.  He did bear weight on his legs however when attempting to take a step he reports severe pain in his back, he sat back down. [AR]   1558 Patient able to bear weight, able to take several steps, this does cause discomfort but he is neurovascularly intact. [AR]   1620 My examination the patient is feeling better.  He is able to sit up in bed a little bit was able to take a few steps. [JC]   1643 Trigger point injection performed with one-to-one bupivacaine and lidocaine, performed by my attending.  Patient reports significant improvement in pain, he was able to ambulate and take several steps.  Reports significant provement. [AR]   2595 Procedure: Trigger point junction.  Indication: Spasm.  The left lower back had several areas identified via palpation that were spasm.  Verbal consent was obtained.  The area was cleaned with standard aseptic technique.  A mixture of one-to-one bupivacaine and lidocaine was injected into multiple trigger points, a total of 17 cc of the mixture was used.  He experienced much improved pain relief and was able to stand up and take a few steps and felt much better after this.  No complications tolerated well.  Band-Aid applied [JC]      ED Course User Index  [AR] Azrielle Springsteen Jenny Reichmann, PA  [JC] Benay Pillow, MD       MDM    Patient with left-sided lower back pain radiating down left leg.  No midline back pain urinary  retention, urinary incontinence, fecal incontinence, saddle anesthesia, lower extremity weakness, numbness or tingling.  Exam with palpable muscle spasms in left lower paraspinal muscles.  No midline tenderness, deformity or step-off.  Decreased range of motion due to pain.  Normal neurovascular exam to distal lower extremities, muscle strength and sensation intact lower extremities, DTRs normal.  Able to bear weight and ambulate.    CT obtained which revealed mild disc bulge at L4, L5 and L5-S1.  Patient ultimately required multiple rounds of pain medication due to pain with weightbearing and inability to get out of bed.  Patient with improvement in pain and symptoms after each round, my attending did perform trigger point injection locally lower left muscle spasms.  He reported significant improvement after these trigger point injections.  He was able to get out of bed without significant assistance, bear weight and  ambulate several steps.  Pain likely due to muscle strain and spasm with sciatica.  He is neurovascularly intact.  Will provide steroids, lidocaine patches, muscle relaxers as needed.  Gave him a spinal clinic follow-up as needed for further evaluation.  Stable for discharge.  Given strict return precautions.    CONSULTS:  None    FINAL IMPRESSION      1. Strain of lumbar region, initial encounter    2. Spasm of muscle    3. Sciatica of left side          DISPOSITION/PLAN   DISPOSITION Decision To Discharge 02/02/2021 04:44:13 PM      PATIENT REFERRED TO:  Primary care provider    In 1 week      [place]  [place] [id number]  [phone]        DISCHARGE MEDICATIONS:  Discharge Medication List as of 02/02/2021  4:47 PM        START taking these medications    Details   methylPREDNISolone (MEDROL, PAK,) 4 MG tablet Take by mouth.  Follow package instructions., Disp-1 kit, R-0Print      lidocaine (LIDODERM) 5 % Place 1 patch onto the  skin daily for 10 days 12 hours on, 12 hours off., Disp-10 patch, R-0Print      cyclobenzaprine (FLEXERIL) 10 MG tablet Take 1 tablet by mouth 3 times daily as needed for Muscle spasms, Disp-21 tablet, R-0Print           Controlled Substances Monitoring:     No flowsheet data found.    (Please note that portions of this note were completed with a voice recognition program.  Efforts were made to edit the dictations but occasionally words are mis-transcribed.)    Roman Forest, PA (electronically signed)  Attending Emergency Physician           Ozella Almond, [place]  02/02/21 1810

## 2021-02-02 NOTE — ED Notes (Signed)
MORPHINE HELD AS  PT SLEEPING AND SNORING IMMEDIATELY AFTER VALIUM , ALEXA AWARE-      Benedetto Goad, RN  02/02/21 1521

## 2021-02-02 NOTE — ED Notes (Signed)
AMBULATORY TRIAL , PT ABLE TO STAND AND BEAR WEIGHT AND TAKE 2 STEPS, THEN STATED HE COULD NOT      Benedetto Goad, RN  02/02/21 1600

## 2021-02-02 NOTE — ED Notes (Signed)
CRUTCH TEACHING DONE WITH CORRECT RETURN DEMO- PT IN [facility] AT DISCHARGE     Benedetto Goad, RN  02/02/21 1659

## 2021-02-02 NOTE — ED Notes (Signed)
HEMODYNAMIC MONITORING ON     Benedetto Goad, RN  02/02/21 1350

## 2021-02-02 NOTE — ED Notes (Signed)
I VIAL OF BUPIVICAINE  GIVEN PER DR. Clementeen Graham , 1 RETURNED TO [facility]     Benedetto Goad, RN  02/02/21 1652
